# Patient Record
Sex: Female | Born: 1950 | Race: White | Hispanic: No | Marital: Married | Smoking: Former smoker
Health system: Southern US, Community
[De-identification: ages and names within clinical notes are randomized; demographics above are authoritative.]

## PROBLEM LIST (undated history)

## (undated) DIAGNOSIS — I4891 Unspecified atrial fibrillation: Secondary | ICD-10-CM

## (undated) DIAGNOSIS — E079 Disorder of thyroid, unspecified: Secondary | ICD-10-CM

---

## 2020-08-14 ENCOUNTER — Emergency Department (INDEPENDENT_AMBULATORY_CARE_PROVIDER_SITE_OTHER)
Admission: EM | Admit: 2020-08-14 | Discharge: 2020-08-14 | Disposition: A | Discharge status: Home / Self Care | Payer: Medicare Other | Source: Home / Self Care

## 2020-08-14 ENCOUNTER — Other Ambulatory Visit: Payer: Self-pay

## 2020-08-14 ENCOUNTER — Emergency Department (INDEPENDENT_AMBULATORY_CARE_PROVIDER_SITE_OTHER): Payer: Medicare Other

## 2020-08-14 DIAGNOSIS — W1849XA Other slipping, tripping and stumbling without falling, initial encounter: Secondary | ICD-10-CM | POA: Diagnosis not present

## 2020-08-14 DIAGNOSIS — S82831A Other fracture of upper and lower end of right fibula, initial encounter for closed fracture: Secondary | ICD-10-CM | POA: Diagnosis not present

## 2020-08-14 HISTORY — DX: Unspecified atrial fibrillation: I48.91

## 2020-08-14 HISTORY — DX: Disorder of thyroid, unspecified: E07.9

## 2020-08-14 NOTE — ED Triage Notes (Signed)
Patient presents to Urgent Care with complaints of right ankle pain since slipping on acorns earlier today. Patient reports she is not able to bear weight, swelling noted. No pain meds pta.

## 2020-08-14 NOTE — Discharge Instructions (Signed)
  You may take 500mg  acetaminophen every 4-6 hours or in combination with ibuprofen 400-600mg  every 6-8 hours as needed for pain and inflammation.  You should wear a boot to help protect your leg while it heals as well as using crutches to help keep weight off your Right foot until you follow up with an orthopedist or sports medicine provider.  Call to schedule an appointment with Sports Medicine in 1-2 weeks for ongoing management of your fracture.

## 2020-08-14 NOTE — ED Provider Notes (Addendum)
[facility]    CSN: [id number] Arrival date & time: 08/14/20  1251      History   Chief Complaint Chief Complaint  Patient presents with  . Ankle Injury    Right    HPI Marissa Garrett is a 69 y.o. female.   HPI  Marissa Garrett is a 69 y.o. female presenting to UC with c/o Right ankle pain and swelling after slipping on acorns earlier today at home, rolling her ankle.  Pain is 9/10, unable to bear weight. No medication taken PTA. No prior fracture to same ankle.   Past Medical History:  Diagnosis Date  . Atrial fibrillation (HCC)   . Thyroid disease     There are no problems to display for this patient.   History reviewed. No pertinent surgical history.  OB History   No obstetric history on file.      Home Medications    Prior to Admission medications   Medication Sig Start Date End Date Taking? Authorizing Provider  candesartan (ATACAND) 16 MG tablet Take 16 mg by mouth daily.   Yes [provider]  diltiazem (TIAZAC) 240 MG 24 hr capsule Take 240 mg by mouth daily.   Yes [provider]  ferrous sulfate 325 (65 FE) MG tablet Take 325 mg by mouth daily with breakfast.   Yes [provider]  levothyroxine (SYNTHROID) 88 MCG tablet Take 88 mcg by mouth daily before breakfast.   Yes [provider]  omeprazole (PRILOSEC) 20 MG capsule Take 20 mg by mouth daily.   Yes [provider]    Family History Family History  Problem Relation Age of Onset  . Alzheimer's disease Mother   . Healthy Father     Social History Social History   Tobacco Use  . Smoking status: Former Smoker    Types: Cigarettes    Quit date: 12/16/2019    Years since quitting: 0.6  . Smokeless tobacco: Never Used  Vaping Use  . Vaping Use: Never used  Substance Use Topics  . Alcohol use: Yes    Alcohol/week: 14.0 standard drinks    Types: 14 Glasses of wine per week  . Drug use: Never     Allergies   Patient has no known  allergies.   Review of Systems Review of Systems  Musculoskeletal: Positive for arthralgias and joint swelling.  Skin: Negative for color change and wound.     Physical Exam Triage Vital Signs ED Triage Vitals  Enc Vitals Group     BP 08/14/20 1311 (!) 103/57     Pulse Rate 08/14/20 1311 (!) 53     Resp 08/14/20 1311 16     Temp 08/14/20 1311 98.5 F (36.9 C)     Temp Source 08/14/20 1311 Oral     SpO2 08/14/20 1311 97 %     Weight --      Height --      Head Circumference --      Peak Flow --      Pain Score 08/14/20 1307 9     Pain Loc --      Pain Edu? --      Excl. in GC? --    No data found.  Updated Vital Signs BP (!) 103/57 (BP Location: Right Arm)   Pulse (!) 53   Temp 98.5 F (36.9 C) (Oral)   Resp 16   SpO2 97%   Visual Acuity Right Eye Distance:   Left Eye Distance:  Bilateral Distance:    Right Eye Near:   Left Eye Near:    Bilateral Near:     Physical Exam Vitals and nursing note reviewed.  Constitutional:      Appearance: Normal appearance. She is well-developed.  HENT:     Head: Normocephalic and atraumatic.  Cardiovascular:     Rate and Rhythm: Bradycardia present.     Pulses:          Dorsalis pedis pulses are 2+ on the right side.       Posterior tibial pulses are 2+ on the right side.  Pulmonary:     Effort: Pulmonary effort is normal.  Musculoskeletal:        General: Swelling and tenderness present. Normal range of motion.     Cervical back: Normal range of motion.     Comments: Right ankle: mild to moderate edema lateral aspect. Tenderness. Full ROM Calf is soft, non-tender No tenderness to foot.   Skin:    General: Skin is warm and dry.     Capillary Refill: Capillary refill takes less than 2 seconds.     Findings: No bruising or erythema.  Neurological:     Mental Status: She is alert and oriented to person, place, and time.     Sensory: No sensory deficit.  Psychiatric:        Behavior: Behavior normal.      UC  Treatments / Results  Labs (all labs ordered are listed, but only abnormal results are displayed) Labs Reviewed - No data to display  EKG   Radiology DG Ankle Complete Right  Result Date: 08/14/2020 CLINICAL DATA:  Right ankle pain EXAM: RIGHT ANKLE - COMPLETE 3+ VIEW COMPARISON:  None. FINDINGS: Three view radiograph right ankle demonstrates an acute, minimally displaced, anatomically aligned oblique fracture of the distal right fibula extent the level of the tibial plafond. No other fracture or dislocation. Ankle mortise is intact. Moderate soft tissue swelling superficial to the lateral malleolus. IMPRESSION: Minimally displaced, anatomically aligned fibular fracture (Weber class B) Electronically Signed   By: Helyn Numbers MD   On: 08/14/2020 13:26    Procedures Procedures (including critical care time)  Medications Ordered in UC Medications - No data to display  Initial Impression / Assessment and Plan / UC Course  I have reviewed the triage vital signs and the nursing notes.  Pertinent labs & imaging results that were available during my care of the patient were reviewed by me and considered in my medical decision making (see chart for details).     Discussed imaging with pt Pt has walking boot and crutches already. Encouraged to use F/u with Sports Medicine by next week for ongoing management of fracture. AVS given  Final Clinical Impressions(s) / UC Diagnoses   Final diagnoses:  Other closed fracture of distal end of right fibula, initial encounter     Discharge Instructions      You may take 500mg  acetaminophen every 4-6 hours or in combination with ibuprofen 400-600mg  every 6-8 hours as needed for pain and inflammation.  You should wear a boot to help protect your leg while it heals as well as using crutches to help keep weight off your Right foot until you follow up with an orthopedist or sports medicine provider.  Call to schedule an appointment with Sports  Medicine in 1-2 weeks for ongoing management of your fracture.      ED Prescriptions    None     PDMP not reviewed  this encounter.   Lurene Shadow, PA-C 08/14/20 1950    Lurene Shadow, PA-C 08/14/20 1951

## 2020-08-19 ENCOUNTER — Ambulatory Visit (HOSPITAL_BASED_OUTPATIENT_CLINIC_OR_DEPARTMENT_OTHER)
Admission: RE | Admit: 2020-08-19 | Discharge: 2020-08-19 | Disposition: A | Discharge status: Home / Self Care | Payer: Medicare Other | Source: Ambulatory Visit | Attending: Family Medicine | Admitting: Family Medicine

## 2020-08-19 ENCOUNTER — Encounter: Payer: Self-pay | Admitting: Family Medicine

## 2020-08-19 ENCOUNTER — Other Ambulatory Visit: Payer: Self-pay

## 2020-08-19 ENCOUNTER — Ambulatory Visit (INDEPENDENT_AMBULATORY_CARE_PROVIDER_SITE_OTHER): Payer: Medicare Other | Admitting: Family Medicine

## 2020-08-19 VITALS — BP 177/78 | HR 65 | Ht 65.0 in | Wt 145.0 lb

## 2020-08-19 DIAGNOSIS — S82831A Other fracture of upper and lower end of right fibula, initial encounter for closed fracture: Secondary | ICD-10-CM | POA: Diagnosis present

## 2020-08-19 IMAGING — DX DG ANKLE COMPLETE 3+V*R*
3 series · 3 of 3 positions shown · non-contrast
Comparison: [DATE]

CLINICAL DATA: Right ankle fracture.

EXAM:
RIGHT ANKLE - COMPLETE 3+ VIEW

[ankle ap]
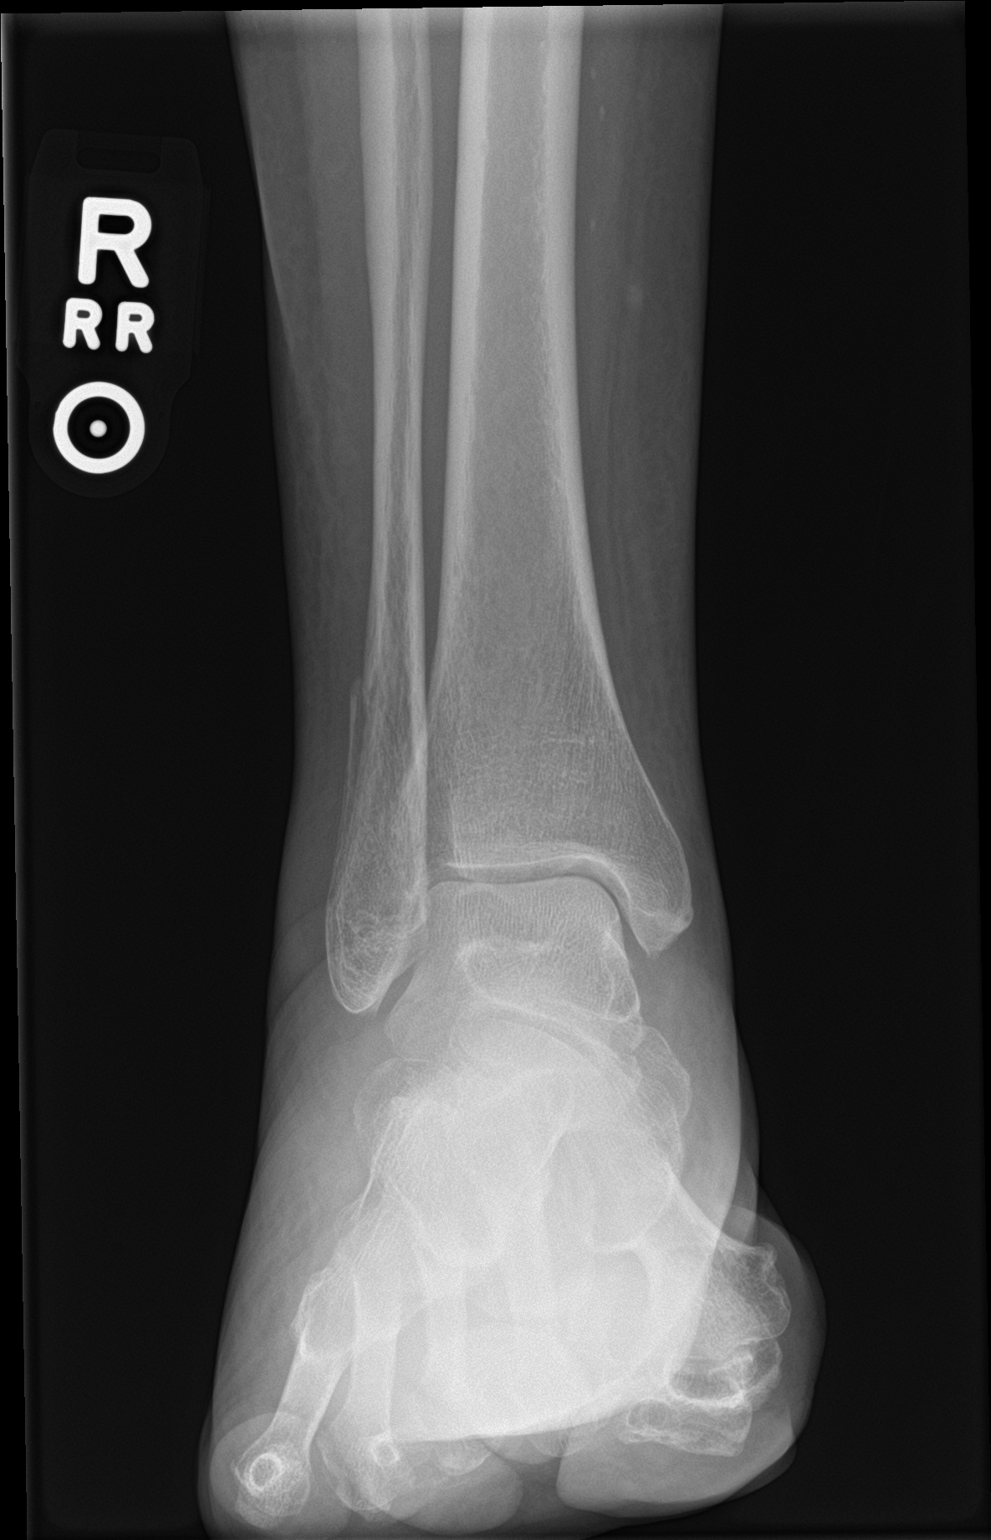

[ankle obl]
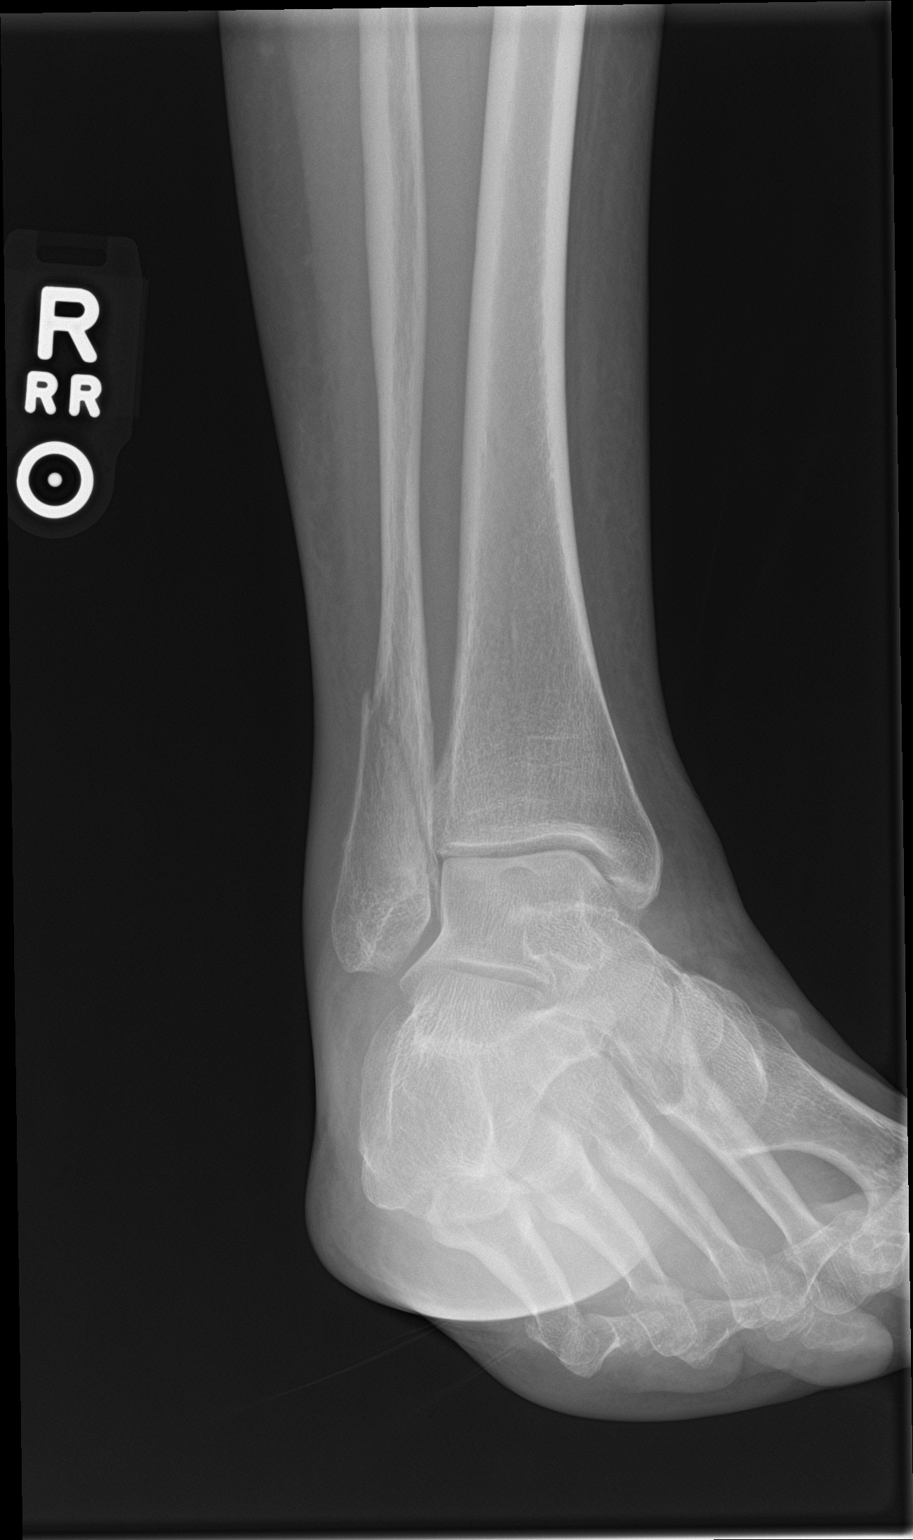

[ankle lat]
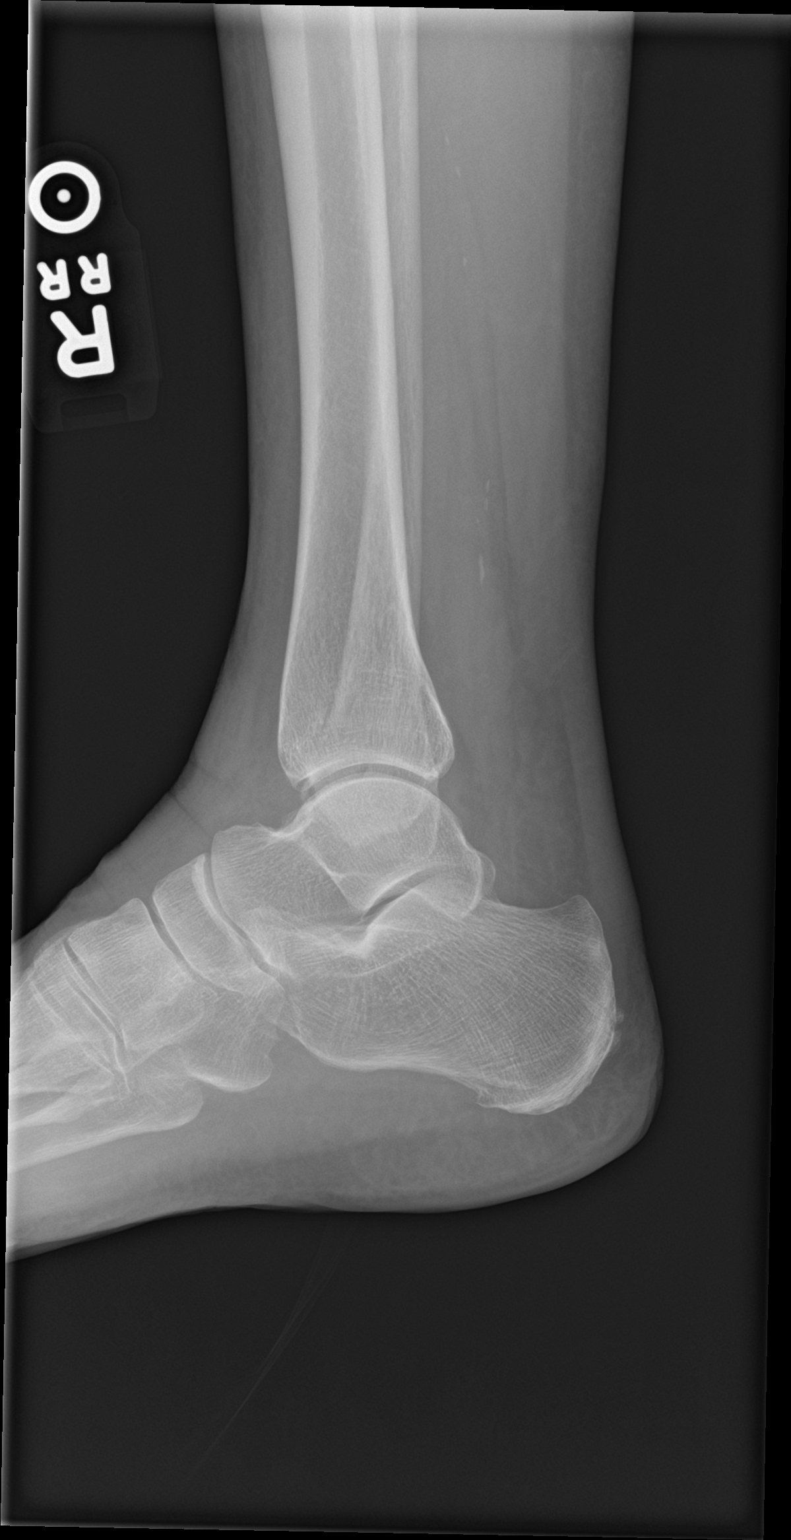

[3 of 3 positions shown; findings below may reference images not displayed]

FINDINGS: Oblique minimally displaced ununited fracture of the distal fibular
metaphysis with overlying soft tissue swelling. No other acute
fracture or dislocation. No aggressive osseous lesion.
IMPRESSION: Stable oblique minimally displaced ununited fracture of the distal
fibular metaphysis with overlying soft tissue swelling.

## 2020-08-19 NOTE — Progress Notes (Signed)
Marissa Garrett - 69 y.o. female MRN [id number]  Date of birth: 16-Feb-1951  SUBJECTIVE:  Including CC & ROS.  Chief Complaint  Patient presents with  . Ankle Injury    right x 08/14/2020    Marissa Garrett is a 69 y.o. female that is presenting with right ankle pain.  She had an injury a few days ago where she had an inversion of her foot.  Since that time she has had pain and swelling.  She is now currently in a cam walker.  She is having much improvement in her pain.  Independent review the right ankle x-ray from 10/22 shows a nondisplaced distal fibula fracture.   Review of Systems See HPI   HISTORY: Past Medical, Surgical, Social, and Family History Reviewed & Updated per EMR.   Pertinent Historical Findings include:  Past Medical History:  Diagnosis Date  . Atrial fibrillation (HCC)   . Thyroid disease     No past surgical history on file.  Family History  Problem Relation Age of Onset  . Alzheimer's disease Mother   . Healthy Father     Social History   Socioeconomic History  . Marital status: Married    Spouse name: Not on file  . Number of children: Not on file  . Years of education: Not on file  . Highest education level: Not on file  Occupational History  . Not on file  Tobacco Use  . Smoking status: Former Smoker    Types: Cigarettes    Quit date: 12/16/2019    Years since quitting: 0.6  . Smokeless tobacco: Never Used  Vaping Use  . Vaping Use: Never used  Substance and Sexual Activity  . Alcohol use: Yes    Alcohol/week: 14.0 standard drinks    Types: 14 Glasses of wine per week  . Drug use: Never  . Sexual activity: Not on file  Other Topics Concern  . Not on file  Social History Narrative  . Not on file   Social Determinants of Health   Financial Resource Strain:   . Difficulty of Paying Living Expenses: Not on file  Food Insecurity:   . Worried About Programme researcher, broadcasting/film/video in the Last Year: Not on file  . Ran Out of Food in the Last Year:  Not on file  Transportation Needs:   . Lack of Transportation (Medical): Not on file  . Lack of Transportation (Non-Medical): Not on file  Physical Activity:   . Days of Exercise per Week: Not on file  . Minutes of Exercise per Session: Not on file  Stress:   . Feeling of Stress : Not on file  Social Connections:   . Frequency of Communication with Friends and Family: Not on file  . Frequency of Social Gatherings with Friends and Family: Not on file  . Attends Religious Services: Not on file  . Active Member of Clubs or Organizations: Not on file  . Attends Banker Meetings: Not on file  . Marital Status: Not on file  Intimate Partner Violence:   . Fear of Current or Ex-Partner: Not on file  . Emotionally Abused: Not on file  . Physically Abused: Not on file  . Sexually Abused: Not on file     PHYSICAL EXAM:  VS: BP (!) 177/78   Pulse 65   Ht 5\' 5"  (1.651 m)   Wt 145 lb (65.8 kg)   BMI 24.13 kg/m  Physical Exam Gen: NAD, alert, cooperative with exam, well-appearing  MSK:  Right ankle: Improvement of ecchymosis and swelling over the distal fibula. Limited plantar flexion dorsiflexion. Neurovascularly intact     ASSESSMENT & PLAN:   Closed fracture of right distal fibula Injury occurred on 10/22.  Pain has improved. -Counseled on home exercise therapy and supportive care. -Continue cam walker. -X-ray. -Follow-up in 3 weeks.

## 2020-08-19 NOTE — Assessment & Plan Note (Signed)
Injury occurred on 10/22.  Pain has improved. -Counseled on home exercise therapy and supportive care. -Continue cam walker. -X-ray. -Follow-up in 3 weeks.

## 2020-08-19 NOTE — Patient Instructions (Signed)
Nice to meet you Please use ice as needed  Please do range of motion movements if seated  Please continue the boot  I will call with the xray results.  Please use vitamin D 2000 IU and calcium 1200 mg  Please try vitamin K2  Please check your bone density.   Please send me a message in MyChart with any questions or updates.  Please see me back in 3 weeks.   --Dr. Jordan Likes

## 2020-08-21 ENCOUNTER — Telehealth: Payer: Self-pay | Admitting: Family Medicine

## 2020-08-21 NOTE — Telephone Encounter (Signed)
Informed of results.   Myra Rude, MD [facility] 08/21/2020, 11:44 AM

## 2020-08-24 ENCOUNTER — Ambulatory Visit: Payer: Medicare Other | Admitting: Family Medicine

## 2020-09-09 ENCOUNTER — Encounter: Payer: Self-pay | Admitting: Family Medicine

## 2020-09-09 ENCOUNTER — Other Ambulatory Visit: Payer: Self-pay

## 2020-09-09 ENCOUNTER — Ambulatory Visit (HOSPITAL_BASED_OUTPATIENT_CLINIC_OR_DEPARTMENT_OTHER)
Admission: RE | Admit: 2020-09-09 | Discharge: 2020-09-09 | Disposition: A | Discharge status: Home / Self Care | Payer: Medicare Other | Source: Ambulatory Visit | Attending: Family Medicine | Admitting: Family Medicine

## 2020-09-09 ENCOUNTER — Ambulatory Visit: Payer: Medicare Other | Admitting: Family Medicine

## 2020-09-09 VITALS — BP 165/88 | HR 66 | Ht 65.0 in | Wt 140.0 lb

## 2020-09-09 DIAGNOSIS — S82831D Other fracture of upper and lower end of right fibula, subsequent encounter for closed fracture with routine healing: Secondary | ICD-10-CM | POA: Insufficient documentation

## 2020-09-09 DIAGNOSIS — Z13820 Encounter for screening for osteoporosis: Secondary | ICD-10-CM

## 2020-09-09 NOTE — Patient Instructions (Signed)
Good to see you Please continue the boot  I will call with the results from today   Please send me a message in MyChart with any questions or updates.  Please see me back in 3 weeks.   --Dr. Jordan Likes

## 2020-09-09 NOTE — Progress Notes (Signed)
Marissa Garrett - 69 y.o. female MRN [id number]  Date of birth: 27-Apr-1951  SUBJECTIVE:  Including CC & ROS.  Chief Complaint  Patient presents with  . Follow-up    right ankle    Marissa Garrett is a 69 y.o. female that is following up for her right ankle fracture.  No pain on exam.  Has been continue to wear the cam walker.   Review of Systems See HPI   HISTORY: Past Medical, Surgical, Social, and Family History Reviewed & Updated per EMR.   Pertinent Historical Findings include:  Past Medical History:  Diagnosis Date  . Atrial fibrillation (HCC)   . Thyroid disease     No past surgical history on file.  Family History  Problem Relation Age of Onset  . Alzheimer's disease Mother   . Healthy Father     Social History   Socioeconomic History  . Marital status: Married    Spouse name: Not on file  . Number of children: Not on file  . Years of education: Not on file  . Highest education level: Not on file  Occupational History  . Not on file  Tobacco Use  . Smoking status: Former Smoker    Types: Cigarettes    Quit date: 12/16/2019    Years since quitting: 0.7  . Smokeless tobacco: Never Used  Vaping Use  . Vaping Use: Never used  Substance and Sexual Activity  . Alcohol use: Yes    Alcohol/week: 14.0 standard drinks    Types: 14 Glasses of wine per week  . Drug use: Never  . Sexual activity: Not on file  Other Topics Concern  . Not on file  Social History Narrative  . Not on file   Social Determinants of Health   Financial Resource Strain:   . Difficulty of Paying Living Expenses: Not on file  Food Insecurity:   . Worried About Programme researcher, broadcasting/film/video in the Last Year: Not on file  . Ran Out of Food in the Last Year: Not on file  Transportation Needs:   . Lack of Transportation (Medical): Not on file  . Lack of Transportation (Non-Medical): Not on file  Physical Activity:   . Days of Exercise per Week: Not on file  . Minutes of Exercise per Session:  Not on file  Stress:   . Feeling of Stress : Not on file  Social Connections:   . Frequency of Communication with Friends and Family: Not on file  . Frequency of Social Gatherings with Friends and Family: Not on file  . Attends Religious Services: Not on file  . Active Member of Clubs or Organizations: Not on file  . Attends Banker Meetings: Not on file  . Marital Status: Not on file  Intimate Partner Violence:   . Fear of Current or Ex-Partner: Not on file  . Emotionally Abused: Not on file  . Physically Abused: Not on file  . Sexually Abused: Not on file     PHYSICAL EXAM:  VS: BP (!) 165/88   Pulse 66   Ht 5\' 5"  (1.651 m)   Wt 140 lb (63.5 kg)   BMI 23.30 kg/m  Physical Exam Gen: NAD, alert, cooperative with exam, well-appearing MSK:  Right ankle: No swelling or ecchymosis. No tenderness to palpation of the fibula. Normal ankle range of motion. Neurovascular intact     ASSESSMENT & PLAN:   Closed fracture of right distal fibula Injury occurred on 10/22.  Looks well on exam. -  Counseled on home exercise therapy and supportive care. -Continue cam walker. -X-ray.

## 2020-09-09 NOTE — Assessment & Plan Note (Addendum)
Injury occurred on 10/22.  Looks well on exam. -Counseled on home exercise therapy and supportive care. -Continue cam walker. -X-ray.

## 2020-09-10 ENCOUNTER — Telehealth: Payer: Self-pay | Admitting: Family Medicine

## 2020-09-10 NOTE — Telephone Encounter (Signed)
Informed of results.   Myra Rude, MD [facility] 09/10/2020, 8:57 AM

## 2020-09-14 ENCOUNTER — Ambulatory Visit (HOSPITAL_BASED_OUTPATIENT_CLINIC_OR_DEPARTMENT_OTHER)
Admission: RE | Admit: 2020-09-14 | Discharge: 2020-09-14 | Disposition: A | Discharge status: Home / Self Care | Payer: Medicare Other | Source: Ambulatory Visit | Attending: Family Medicine | Admitting: Family Medicine

## 2020-09-14 ENCOUNTER — Other Ambulatory Visit: Payer: Self-pay

## 2020-09-14 DIAGNOSIS — Z13820 Encounter for screening for osteoporosis: Secondary | ICD-10-CM | POA: Insufficient documentation

## 2020-09-14 DIAGNOSIS — E039 Hypothyroidism, unspecified: Secondary | ICD-10-CM | POA: Insufficient documentation

## 2020-09-14 DIAGNOSIS — M8589 Other specified disorders of bone density and structure, multiple sites: Secondary | ICD-10-CM | POA: Insufficient documentation

## 2020-09-14 DIAGNOSIS — Z780 Asymptomatic menopausal state: Secondary | ICD-10-CM | POA: Diagnosis not present

## 2020-09-15 ENCOUNTER — Telehealth: Payer: Self-pay | Admitting: Family Medicine

## 2020-09-15 NOTE — Telephone Encounter (Signed)
Informed of bone density.   Myra Rude, MD [facility] 09/15/2020, 9:55 AM

## 2020-09-25 ENCOUNTER — Ambulatory Visit: Payer: Medicare Other | Admitting: Family Medicine

## 2020-09-25 ENCOUNTER — Encounter: Payer: Self-pay | Admitting: Family Medicine

## 2020-09-25 ENCOUNTER — Other Ambulatory Visit: Payer: Self-pay

## 2020-09-25 ENCOUNTER — Ambulatory Visit (HOSPITAL_BASED_OUTPATIENT_CLINIC_OR_DEPARTMENT_OTHER)
Admission: RE | Admit: 2020-09-25 | Discharge: 2020-09-25 | Disposition: A | Discharge status: Home / Self Care | Payer: Medicare Other | Source: Ambulatory Visit | Attending: Family Medicine | Admitting: Family Medicine

## 2020-09-25 DIAGNOSIS — S82831D Other fracture of upper and lower end of right fibula, subsequent encounter for closed fracture with routine healing: Secondary | ICD-10-CM | POA: Diagnosis present

## 2020-09-25 NOTE — Patient Instructions (Signed)
Good to see you  Please try the new brace for another 2 weeks  Please try the exercises  I will call with the results  Please send me a message in MyChart with any questions or updates.  I'll talk about follow up when we discuss the results. .   --Dr. Jordan Likes

## 2020-09-25 NOTE — Progress Notes (Signed)
  Marissa Garrett - 69 y.o. female MRN [id number]  Date of birth: 01-18-1951  SUBJECTIVE:  Including CC & ROS.  Chief Complaint  Patient presents with  . Follow-up    right ankle    Marissa Garrett is a 69 y.o. female that is following up for her right fibular fracture.  She is doing well with no pain..   Review of Systems See HPI   HISTORY: Past Medical, Surgical, Social, and Family History Reviewed & Updated per EMR.   Pertinent Historical Findings include:  Past Medical History:  Diagnosis Date  . Atrial fibrillation (HCC)   . Thyroid disease     No past surgical history on file.  Family History  Problem Relation Age of Onset  . Alzheimer's disease Mother   . Healthy Father     Social History   Socioeconomic History  . Marital status: Married    Spouse name: Not on file  . Number of children: Not on file  . Years of education: Not on file  . Highest education level: Not on file  Occupational History  . Not on file  Tobacco Use  . Smoking status: Former Smoker    Types: Cigarettes    Quit date: 12/16/2019    Years since quitting: 0.7  . Smokeless tobacco: Never Used  Vaping Use  . Vaping Use: Never used  Substance and Sexual Activity  . Alcohol use: Yes    Alcohol/week: 14.0 standard drinks    Types: 14 Glasses of wine per week  . Drug use: Never  . Sexual activity: Not on file  Other Topics Concern  . Not on file  Social History Narrative  . Not on file   Social Determinants of Health   Financial Resource Strain:   . Difficulty of Paying Living Expenses: Not on file  Food Insecurity:   . Worried About Programme researcher, broadcasting/film/video in the Last Year: Not on file  . Ran Out of Food in the Last Year: Not on file  Transportation Needs:   . Lack of Transportation (Medical): Not on file  . Lack of Transportation (Non-Medical): Not on file  Physical Activity:   . Days of Exercise per Week: Not on file  . Minutes of Exercise per Session: Not on file  Stress:   .  Feeling of Stress : Not on file  Social Connections:   . Frequency of Communication with Friends and Family: Not on file  . Frequency of Social Gatherings with Friends and Family: Not on file  . Attends Religious Services: Not on file  . Active Member of Clubs or Organizations: Not on file  . Attends Banker Meetings: Not on file  . Marital Status: Not on file  Intimate Partner Violence:   . Fear of Current or Ex-Partner: Not on file  . Emotionally Abused: Not on file  . Physically Abused: Not on file  . Sexually Abused: Not on file     PHYSICAL EXAM:  VS: BP (!) 142/70   Pulse (!) 56   Ht 5\' 5"  (1.651 m)   Wt 143 lb (64.9 kg)   BMI 23.80 kg/m  Physical Exam Gen: NAD, alert, cooperative with exam, well-appearing    ASSESSMENT & PLAN:   Closed fracture of right distal fibula Doing well with no pain.  Initial injury was 10/22. -Counseled on home exercise therapy and supportive care. -X-ray. -[facility].

## 2020-09-25 NOTE — Assessment & Plan Note (Addendum)
Doing well with no pain.  Initial injury was 10/22. -Counseled on home exercise therapy and supportive care. -X-ray. -[facility].

## 2020-10-05 ENCOUNTER — Telehealth: Payer: Self-pay | Admitting: Family Medicine

## 2020-10-05 NOTE — Telephone Encounter (Signed)
Informed of results.   Myra Rude, MD [facility] 10/05/2020, 8:59 AM

## 2023-02-07 ENCOUNTER — Encounter: Payer: Self-pay | Admitting: *Deleted
